# Patient Record
Sex: Male | Born: 1965 | Race: Black or African American | Hispanic: No | Marital: Married | Smoking: Never smoker
Health system: Southern US, Community
[De-identification: ages and names within clinical notes are randomized; demographics above are authoritative.]

## PROBLEM LIST (undated history)

## (undated) DIAGNOSIS — E119 Type 2 diabetes mellitus without complications: Secondary | ICD-10-CM

## (undated) DIAGNOSIS — G4733 Obstructive sleep apnea (adult) (pediatric): Secondary | ICD-10-CM

---

## 2021-01-16 ENCOUNTER — Emergency Department (HOSPITAL_COMMUNITY): Payer: PRIVATE HEALTH INSURANCE

## 2021-01-16 ENCOUNTER — Other Ambulatory Visit: Payer: Self-pay

## 2021-01-16 ENCOUNTER — Observation Stay (HOSPITAL_COMMUNITY)
Admission: EM | Admit: 2021-01-16 | Discharge: 2021-01-18 | Disposition: A | Discharge status: Home / Self Care | Payer: PRIVATE HEALTH INSURANCE | Attending: Family Medicine | Admitting: Family Medicine

## 2021-01-16 ENCOUNTER — Encounter (HOSPITAL_COMMUNITY): Payer: Self-pay

## 2021-01-16 DIAGNOSIS — Z794 Long term (current) use of insulin: Secondary | ICD-10-CM | POA: Insufficient documentation

## 2021-01-16 DIAGNOSIS — E119 Type 2 diabetes mellitus without complications: Secondary | ICD-10-CM | POA: Insufficient documentation

## 2021-01-16 DIAGNOSIS — R42 Dizziness and giddiness: Secondary | ICD-10-CM | POA: Diagnosis present

## 2021-01-16 DIAGNOSIS — G510 Bell's palsy: Principal | ICD-10-CM | POA: Insufficient documentation

## 2021-01-16 DIAGNOSIS — Z7984 Long term (current) use of oral hypoglycemic drugs: Secondary | ICD-10-CM | POA: Insufficient documentation

## 2021-01-16 DIAGNOSIS — Z20822 Contact with and (suspected) exposure to covid-19: Secondary | ICD-10-CM | POA: Diagnosis not present

## 2021-01-16 HISTORY — DX: Type 2 diabetes mellitus without complications: E11.9

## 2021-01-16 HISTORY — DX: Obstructive sleep apnea (adult) (pediatric): G47.33

## 2021-01-16 LAB — CBC WITH DIFFERENTIAL/PLATELET
Abs Immature Granulocytes: 0.05 10*3/uL (ref 0.00–0.07)
Basophils Absolute: 0 10*3/uL (ref 0.0–0.1)
Basophils Relative: 0 %
Eosinophils Absolute: 0.1 10*3/uL (ref 0.0–0.5)
Eosinophils Relative: 1 %
HCT: 41.1 % (ref 39.0–52.0)
Hemoglobin: 14 g/dL (ref 13.0–17.0)
Immature Granulocytes: 1 %
Lymphocytes Relative: 17 %
Lymphs Abs: 1.1 10*3/uL (ref 0.7–4.0)
MCH: 29.6 pg (ref 26.0–34.0)
MCHC: 34.1 g/dL (ref 30.0–36.0)
MCV: 86.9 fL (ref 80.0–100.0)
Monocytes Absolute: 0.4 10*3/uL (ref 0.1–1.0)
Monocytes Relative: 5 %
Neutro Abs: 5.2 10*3/uL (ref 1.7–7.7)
Neutrophils Relative %: 76 %
Platelets: 170 10*3/uL (ref 150–400)
RBC: 4.73 MIL/uL (ref 4.22–5.81)
RDW: 13 % (ref 11.5–15.5)
WBC: 6.8 10*3/uL (ref 4.0–10.5)
nRBC: 0 % (ref 0.0–0.2)

## 2021-01-16 LAB — COMPREHENSIVE METABOLIC PANEL
ALT: 53 U/L — ABNORMAL HIGH (ref 0–44)
AST: 28 U/L (ref 15–41)
Albumin: 4.3 g/dL (ref 3.5–5.0)
Alkaline Phosphatase: 57 U/L (ref 38–126)
Anion gap: 9 (ref 5–15)
BUN: 13 mg/dL (ref 6–20)
CO2: 25 mmol/L (ref 22–32)
Calcium: 9.1 mg/dL (ref 8.9–10.3)
Chloride: 104 mmol/L (ref 98–111)
Creatinine, Ser: 0.73 mg/dL (ref 0.61–1.24)
GFR, Estimated: >60 mL/min (ref >60)
Glucose, Bld: 265 mg/dL — ABNORMAL HIGH (ref 70–99)
Potassium: 4.2 mmol/L (ref 3.5–5.1)
Sodium: 138 mmol/L (ref 135–145)
Total Bilirubin: 1.1 mg/dL (ref 0.3–1.2)
Total Protein: 6.8 g/dL (ref 6.5–8.1)

## 2021-01-16 LAB — CBC
HCT: 40.6 % (ref 39.0–52.0)
Hemoglobin: 13.6 g/dL (ref 13.0–17.0)
MCH: 29.4 pg (ref 26.0–34.0)
MCHC: 33.5 g/dL (ref 30.0–36.0)
MCV: 87.9 fL (ref 80.0–100.0)
Platelets: 194 10*3/uL (ref 150–400)
RBC: 4.62 MIL/uL (ref 4.22–5.81)
RDW: 13.1 % (ref 11.5–15.5)
WBC: 7.7 10*3/uL (ref 4.0–10.5)
nRBC: 0 % (ref 0.0–0.2)

## 2021-01-16 LAB — GLUCOSE, CAPILLARY
Glucose-Capillary: 163 mg/dL — ABNORMAL HIGH (ref 70–99)
Glucose-Capillary: 162 mg/dL — ABNORMAL HIGH (ref 70–99)

## 2021-01-16 LAB — LIPASE, BLOOD: Lipase: 25 U/L (ref 11–51)

## 2021-01-16 LAB — HIV ANTIBODY (ROUTINE TESTING W REFLEX): HIV Screen 4th Generation wRfx: NONREACTIVE

## 2021-01-16 LAB — CREATININE, SERUM
Creatinine, Ser: 0.78 mg/dL (ref 0.61–1.24)
GFR, Estimated: >60 mL/min (ref >60)

## 2021-01-16 LAB — SARS CORONAVIRUS 2 (TAT 6-24 HRS): SARS Coronavirus 2: NEGATIVE

## 2021-01-16 LAB — HEMOGLOBIN A1C
Hgb A1c MFr Bld: 8.8 % — ABNORMAL HIGH (ref 4.8–5.6)
Mean Plasma Glucose: 205.86 mg/dL

## 2021-01-16 MED ORDER — SODIUM CHLORIDE 0.9 % IV BOLUS
1000.0000 mL | Freq: Once | INTRAVENOUS | Status: AC
  Administered 2021-01-16: 1000 mL via INTRAVENOUS

## 2021-01-16 MED ORDER — ENOXAPARIN SODIUM 60 MG/0.6ML ~~LOC~~ SOLN
60.0000 mg | SUBCUTANEOUS | Status: DC
  Administered 2021-01-16 – 2021-01-17 (×2): 60 mg via SUBCUTANEOUS
  Filled 2021-01-16 (×2): qty 0.6

## 2021-01-16 MED ORDER — INSULIN ASPART 100 UNIT/ML ~~LOC~~ SOLN
0.0000 [IU] | Freq: Every day | SUBCUTANEOUS | Status: DC
  Administered 2021-01-17: 3 [IU] via SUBCUTANEOUS

## 2021-01-16 MED ORDER — INSULIN ASPART 100 UNIT/ML ~~LOC~~ SOLN
0.0000 [IU] | Freq: Three times a day (TID) | SUBCUTANEOUS | Status: DC
  Administered 2021-01-16: 4 [IU] via SUBCUTANEOUS
  Administered 2021-01-17: 7 [IU] via SUBCUTANEOUS
  Administered 2021-01-18: 11 [IU] via SUBCUTANEOUS
  Administered 2021-01-18: 3 [IU] via SUBCUTANEOUS

## 2021-01-16 MED ORDER — ACETAMINOPHEN 325 MG PO TABS: 650.0000 mg | ORAL_TABLET | Freq: Four times a day (QID) | ORAL | Status: DC | PRN

## 2021-01-16 MED ORDER — POLYVINYL ALCOHOL 1.4 % OP SOLN
1.0000 [drp] | Freq: Every day | OPHTHALMIC | Status: DC
  Administered 2021-01-18: 1 [drp] via OPHTHALMIC
  Filled 2021-01-16 (×2): qty 15

## 2021-01-16 MED ORDER — LORAZEPAM 2 MG/ML IJ SOLN
0.5000 mg | Freq: Once | INTRAMUSCULAR | Status: AC
  Administered 2021-01-16: 0.5 mg via INTRAVENOUS
  Filled 2021-01-16: qty 1

## 2021-01-16 MED ORDER — PIMECROLIMUS 1 % EX CREA: 1.0000 "application " | TOPICAL_CREAM | Freq: Two times a day (BID) | CUTANEOUS | Status: DC

## 2021-01-16 MED ORDER — NAPHAZOLINE-GLYCERIN 0.012-0.2 % OP SOLN: 2.0000 [drp] | Freq: Every morning | OPHTHALMIC | Status: DC

## 2021-01-16 MED ORDER — ONDANSETRON HCL 4 MG/2ML IJ SOLN
4.0000 mg | Freq: Once | INTRAMUSCULAR | Status: AC
  Administered 2021-01-16: 4 mg via INTRAVENOUS
  Filled 2021-01-16: qty 2

## 2021-01-16 MED ORDER — ACETAMINOPHEN 650 MG RE SUPP: 650.0000 mg | Freq: Four times a day (QID) | RECTAL | Status: DC | PRN

## 2021-01-16 MED ORDER — ATORVASTATIN CALCIUM 20 MG PO TABS
20.0000 mg | ORAL_TABLET | Freq: Every day | ORAL | Status: DC
  Administered 2021-01-17 – 2021-01-18 (×2): 20 mg via ORAL
  Filled 2021-01-16 (×2): qty 1

## 2021-01-16 MED ORDER — MECLIZINE HCL 25 MG PO TABS
25.0000 mg | ORAL_TABLET | Freq: Once | ORAL | Status: AC
  Administered 2021-01-16: 25 mg via ORAL
  Filled 2021-01-16: qty 1

## 2021-01-16 MED ORDER — INSULIN NPH (HUMAN) (ISOPHANE) 100 UNIT/ML ~~LOC~~ SUSP
30.0000 [IU] | Freq: Two times a day (BID) | SUBCUTANEOUS | Status: DC
  Administered 2021-01-16 – 2021-01-18 (×4): 30 [IU] via SUBCUTANEOUS
  Filled 2021-01-16: qty 10

## 2021-01-16 MED ORDER — SODIUM CHLORIDE 0.9 % IV SOLN: INTRAVENOUS | Status: DC

## 2021-01-16 MED ORDER — MECLIZINE HCL 25 MG PO TABS
50.0000 mg | ORAL_TABLET | Freq: Three times a day (TID) | ORAL | Status: DC
  Administered 2021-01-16 – 2021-01-18 (×6): 50 mg via ORAL
  Filled 2021-01-16 (×7): qty 2

## 2021-01-16 MED ORDER — PROCHLORPERAZINE EDISYLATE 10 MG/2ML IJ SOLN
10.0000 mg | Freq: Four times a day (QID) | INTRAMUSCULAR | Status: DC | PRN
  Administered 2021-01-16 – 2021-01-17 (×2): 10 mg via INTRAVENOUS
  Filled 2021-01-16 (×2): qty 2

## 2021-01-16 NOTE — ED Notes (Signed)
Pt to MRI

## 2021-01-16 NOTE — ED Triage Notes (Signed)
Pt sts going out to eat last night around 9pm and has been nauseated, vomiting, dizzy since.

## 2021-01-16 NOTE — H&P (Signed)
History and Physical    John Barber OVF:[id number] DOB: [birth year] DOA: 01/16/2021  PCP: Pcp, No  Patient coming from: [facility]  Chief Complaint: "room spinning"  HPI: John Barber is a 55 y.o. male with medical history significant of diabetes. Presenting with dizziness, N/V. Reports that he was at dinner last night. When he went to leave, he became lightheaded and dizziness. He staggered as if he were drunk; but he had no alcohol. He complained of "room spinning" and vomited once he got back to his friend's car. All through out the night, he had continued N/V and dizziness. He was unable to take any medicines to assist. He decided that he needed help in the ED. He denies any other aggravating or alleviating factors.    ED Course: Other than hyperglycemia, his lab work was benign. MRI showed a "minor burden of nonspecific gliosis/demyelination in the cerebral white matter", but was otherwise negative. He was unable to be controlled with antivert, ativan, and zofran. TRH was called for admission.   Review of Systems:  Denies CP, palpitations, dyspnea, hematemesis, fever, visual changes. Review of systems is otherwise negative for all not mentioned in HPI.   PMHx DM2 on insulin  PSHx History reviewed. No pertinent surgical history.  SocHx  reports that he has never smoked. He has never used smokeless tobacco. He reports previous alcohol use. He reports that he does not use drugs.  No Known Allergies  FamHx Review and noncontributory  Prior to Admission medications   Medication Sig Start Date End Date Taking? Authorizing Provider  acetaminophen (TYLENOL) 650 MG CR tablet Take 2,600 mg by mouth daily as needed for pain. 02/12/20 02/12/21 Yes [provider]  atorvastatin (LIPITOR) 20 MG tablet Take 20 mg by mouth daily. 02/17/18 11/27/21 Yes [provider]  glipiZIDE (GLUCOTROL) 10 MG tablet Take 20 mg by mouth 2 (two) times daily before a meal. 01/10/21  01/10/23 Yes [provider]  Insulin NPH, Human,, Isophane, (HUMULIN N KWIKPEN) 100 UNIT/ML Kiwkpen Inject 30-45 Units into the skin See admin instructions. Inject 30 units daily with lunch and 45 units at bedtime. Increase by 2 units at lunch until bedtime blood sugar is less than 140. 07/05/20 07/06/22 Yes [provider]  metFORMIN (GLUCOPHAGE) 1000 MG tablet Take 1,000 mg by mouth 2 (two) times daily. 01/10/21  Yes [provider]  naphazoline-glycerin (CLEAR EYES REDNESS) 0.012-0.2 % SOLN Place 2 drops into both eyes in the morning.   Yes [provider]  pimecrolimus (ELIDEL) 1 % cream Apply 1 application topically in the morning and at bedtime. 09/20/20 10/23/22 Yes [provider]  testosterone cypionate (DEPOTESTOSTERONE CYPIONATE) 200 MG/ML injection Inject 200 mg into the muscle once a week. 01/10/21 07/12/21 Yes [provider]    Physical Exam: Vitals:   01/16/21 0945 01/16/21 1000 01/16/21 1015 01/16/21 1147  BP: 117/78 109/75 118/74 129/74  Pulse: 65 86 72 62  Resp:   16 (!) 21  Temp:      TempSrc:      SpO2: 91% 91% (!) 88% 93%  Weight:      Height:        General: 55 y.o. male resting in bed in NAD Eyes: PERRL, normal sclera ENMT: Nares patent w/o discharge, orophaynx clear, dentition normal, ears w/o discharge/lesions/ulcers Neck: Supple, trachea midline Cardiovascular: RRR, +S1, S2, no m/g/r, equal pulses throughout Respiratory: CTABL, no w/r/r, normal WOB GI: BS+, NDNT, no masses noted, no organomegaly noted MSK: No e/c/c  Neuro: A&O x 3, no focal deficits, nystagmus noted on exam Psyc: Appropriate interaction and affect, calm/cooperative  Labs on Admission: I have personally reviewed following labs and imaging studies  CBC: Recent Labs  Lab 01/16/21 0738  WBC 6.8  NEUTROABS 5.2  HGB 14.0  HCT 41.1  MCV 86.9  PLT 170   Basic Metabolic Panel: Recent Labs  Lab 01/16/21 0738  NA 138  K 4.2  CL 104  CO2  25  GLUCOSE 265*  BUN 13  CREATININE 0.73  CALCIUM 9.1   GFR: Estimated Creatinine Clearance: 143 mL/min (by C-G formula based on SCr of 0.73 mg/dL). Liver Function Tests: Recent Labs  Lab 01/16/21 0738  AST 28  ALT 53*  ALKPHOS 57  BILITOT 1.1  PROT 6.8  ALBUMIN 4.3   Recent Labs  Lab 01/16/21 0738  LIPASE 25   No results for input(s): AMMONIA in the last 168 hours. Coagulation Profile: No results for input(s): INR, PROTIME in the last 168 hours. Cardiac Enzymes: No results for input(s): CKTOTAL, CKMB, CKMBINDEX, TROPONINI in the last 168 hours. BNP (last 3 results) No results for input(s): PROBNP in the last 8760 hours. HbA1C: No results for input(s): HGBA1C in the last 72 hours. CBG: No results for input(s): GLUCAP in the last 168 hours. Lipid Profile: No results for input(s): CHOL, HDL, LDLCALC, TRIG, CHOLHDL, LDLDIRECT in the last 72 hours. Thyroid Function Tests: No results for input(s): TSH, T4TOTAL, FREET4, T3FREE, THYROIDAB in the last 72 hours. Anemia Panel: No results for input(s): VITAMINB12, FOLATE, FERRITIN, TIBC, IRON, RETICCTPCT in the last 72 hours. Urine analysis: No results found for: COLORURINE, APPEARANCEUR, LABSPEC, PHURINE, GLUCOSEU, HGBUR, BILIRUBINUR, KETONESUR, PROTEINUR, UROBILINOGEN, NITRITE, LEUKOCYTESUR  Radiological Exams on Admission: MR BRAIN WO CONTRAST  Result Date: 01/16/2021 CLINICAL DATA:  Vertigo EXAM: MRI HEAD WITHOUT CONTRAST TECHNIQUE: Multiplanar, multiecho pulse sequences of the brain and surrounding structures were obtained without intravenous contrast. COMPARISON:  None. FINDINGS: Brain: There is no acute infarction or intracranial hemorrhage. There is no intracranial mass, mass effect, or edema. There is no hydrocephalus or extra-axial fluid collection. Ventricles and sulci are normal in size and configuration. Few scattered small foci of T2 hyperintensity in the supratentorial white matter likely reflect nonspecific  gliosis/demyelination. Vascular: Major vessel flow voids at the skull base are preserved. Skull and upper cervical spine: Normal marrow signal is preserved. Sinuses/Orbits: Paranasal sinuses are aerated. Orbits are unremarkable. Other: Sella is unremarkable.  Mastoid air cells are clear. IMPRESSION: No evidence of recent infarction, hemorrhage, or mass. Minor burden of nonspecific gliosis/demyelination in the cerebral white matter. Electronically Signed   By: Guadlupe Spanish M.D.   On: 01/16/2021 11:34    EKG: Independently reviewed. Sinus bradycardia  Assessment/Plan Vertigo     - place in obs, med-surg     - will have PT do vestibular eval     - compazine, antivert  DM2     - SSI, DM diet, glucose checks, NPH, A1c  Sinus bradycardia     - asymptomatic, follow up outpt  Elevated LFTs     - minor elevation of ALT     - fluids, trend  DVT prophylaxis: lovenox  Code Status: FULL  Family Communication: None at bedside  Consults called: None   Status is: Observation  The patient remains OBS appropriate and will d/c before 2 midnights.  Dispo: The patient is from: Home              Anticipated d/c is to:  Home              Patient currently is not medically stable to d/c.   Difficult to place patient No  Teddy Spike DO [facility]  If 7PM-7AM, please contact night-coverage [email or web address]  01/16/2021, 12:28 PM

## 2021-01-16 NOTE — ED Provider Notes (Signed)
[facility]-EMERGENCY DEPT Provider Note   CSN: [id number] Arrival date & time: 01/16/21  1443     History Chief Complaint  Patient presents with  . Vomiting    John Barber is a 55 y.o. male.  55 year old male with prior medical history as detailed below presents for evaluation.  Patient reports acute onset of vertigo last night around 9 PM.  He reports multiple episodes of vomiting.  Symptoms are worse with standing or with movement of his head  Denies slurred speech, vision change, focal weakness, or other specific complaint.  He denies prior history of vertigo.  Of note, patient is traveling to [place] from [place].  The history is provided by the patient and medical records.  Dizziness Quality:  Vertigo Severity:  Severe Onset quality:  Sudden Duration:  1 day Timing:  Constant Progression:  Unchanged Chronicity:  New Context: head movement and standing up   Relieved by:  Nothing Worsened by:  Nothing      History reviewed. No pertinent past medical history.  Patient Active Problem List   Diagnosis Date Noted  . Vertigo 01/16/2021    History reviewed. No pertinent surgical history.     No family history on file.  Social History   Tobacco Use  . Smoking status: Never Smoker  . Smokeless tobacco: Never Used  Substance Use Topics  . Alcohol use: Not Currently  . Drug use: Never    Home Medications Prior to Admission medications   Medication Sig Start Date End Date Taking? Authorizing Provider  acetaminophen (TYLENOL) 650 MG CR tablet Take 2,600 mg by mouth daily as needed for pain. 02/12/20 02/12/21 Yes [provider]  atorvastatin (LIPITOR) 20 MG tablet Take 20 mg by mouth daily. 02/17/18 11/27/21 Yes [provider]  glipiZIDE (GLUCOTROL) 10 MG tablet Take 20 mg by mouth 2 (two) times daily before a meal. 01/10/21 01/10/23 Yes [provider]  Insulin NPH, Human,, Isophane, (HUMULIN N  KWIKPEN) 100 UNIT/ML Kiwkpen Inject 30-45 Units into the skin See admin instructions. Inject 30 units daily with lunch and 45 units at bedtime. Increase by 2 units at lunch until bedtime blood sugar is less than 140. 07/05/20 07/06/22 Yes [provider]  metFORMIN (GLUCOPHAGE) 1000 MG tablet Take 1,000 mg by mouth 2 (two) times daily. 01/10/21  Yes [provider]  naphazoline-glycerin (CLEAR EYES REDNESS) 0.012-0.2 % SOLN Place 2 drops into both eyes in the morning.   Yes [provider]  pimecrolimus (ELIDEL) 1 % cream Apply 1 application topically in the morning and at bedtime. 09/20/20 10/23/22 Yes [provider]  testosterone cypionate (DEPOTESTOSTERONE CYPIONATE) 200 MG/ML injection Inject 200 mg into the muscle once a week. 01/10/21 07/12/21 Yes [provider]    Allergies    Patient has no known allergies.  Review of Systems   Review of Systems  Neurological: Positive for dizziness.  All other systems reviewed and are negative.   Physical Exam Updated Vital Signs BP 129/74 (BP Location: Right Arm)   Pulse 62   Temp 98.2 F (36.8 C)   Resp (!) 21   Ht 6' 1.5" (1.867 m)   Wt 117.9 kg   SpO2 93%   BMI 33.84 kg/m   Physical Exam Vitals and nursing note reviewed.  Constitutional:      General: He is not in acute distress.    Appearance: Normal appearance. He is well-developed.  HENT:     Head: Normocephalic and atraumatic.  Eyes:     Conjunctiva/sclera: Conjunctivae normal.     Pupils: Pupils are equal, round, and reactive to light.     Comments: Bilateral horizontal nystagmus  Cardiovascular:     Rate and Rhythm: Normal rate and regular rhythm.     Heart sounds: Normal heart sounds.  Pulmonary:     Effort: Pulmonary effort is normal. No respiratory distress.     Breath sounds: Normal breath sounds.  Abdominal:     General: There is no distension.     Palpations: Abdomen is soft.     Tenderness: There is no abdominal  tenderness.  Musculoskeletal:        General: No deformity. Normal range of motion.     Cervical back: Normal range of motion and neck supple.  Skin:    General: Skin is warm and dry.  Neurological:     General: No focal deficit present.     Mental Status: He is alert and oriented to person, place, and time.     Cranial Nerves: No cranial nerve deficit.     Sensory: No sensory deficit.     Motor: No weakness.     Coordination: Coordination normal.     ED Results / Procedures / Treatments   Labs (all labs ordered are listed, but only abnormal results are displayed) Labs Reviewed  COMPREHENSIVE METABOLIC PANEL - Abnormal; Notable for the following components:      Result Value   Glucose, Bld 265 (*)    ALT 53 (*)    All other components within normal limits  SARS CORONAVIRUS 2 (TAT 6-24 HRS)  LIPASE, BLOOD  CBC WITH DIFFERENTIAL/PLATELET    EKG EKG Interpretation  Date/Time:  Thursday January 16 2021 07:34:59 EDT Ventricular Rate:  48 PR Interval:    QRS Duration: 117 QT Interval:  476 QTC Calculation: 426 R Axis:   98 Text Interpretation: Sinus bradycardia Nonspecific intraventricular conduction delay Confirmed by Kristine Royal [phone]) on 01/16/2021 7:39:10 AM   Radiology MR BRAIN WO CONTRAST  Result Date: 01/16/2021 CLINICAL DATA:  Vertigo EXAM: MRI HEAD WITHOUT CONTRAST TECHNIQUE: Multiplanar, multiecho pulse sequences of the brain and surrounding structures were obtained without intravenous contrast. COMPARISON:  None. FINDINGS: Brain: There is no acute infarction or intracranial hemorrhage. There is no intracranial mass, mass effect, or edema. There is no hydrocephalus or extra-axial fluid collection. Ventricles and sulci are normal in size and configuration. Few scattered small foci of T2 hyperintensity in the supratentorial white matter likely reflect nonspecific gliosis/demyelination. Vascular: Major vessel flow voids at the skull base are preserved. Skull and upper  cervical spine: Normal marrow signal is preserved. Sinuses/Orbits: Paranasal sinuses are aerated. Orbits are unremarkable. Other: Sella is unremarkable.  Mastoid air cells are clear. IMPRESSION: No evidence of recent infarction, hemorrhage, or mass. Minor burden of nonspecific gliosis/demyelination in the cerebral white matter. Electronically Signed   By: Guadlupe Spanish M.D.   On: 01/16/2021 11:34    Procedures Procedures   Medications Ordered in ED Medications  LORazepam (ATIVAN) injection 0.5 mg (has no administration in time range)  sodium chloride 0.9 % bolus 1,000 mL (0 mLs Intravenous Stopped 01/16/21 0830)  ondansetron (ZOFRAN) injection 4 mg (4 mg Intravenous Given 01/16/21 0753)  meclizine (ANTIVERT) tablet 25 mg (25 mg Oral Given 01/16/21 0756)  LORazepam (ATIVAN) injection 0.5 mg (0.5 mg Intravenous Given 01/16/21 0927)  ondansetron (ZOFRAN) injection 4 mg (4 mg Intravenous Given 01/16/21 7106)    ED Course  I have reviewed the triage  vital signs and the nursing notes.  Pertinent labs & imaging results that were available during my care of the patient were reviewed by me and considered in my medical decision making (see chart for details).    MDM Rules/Calculators/A&P                          MDM  Screen complete  John Barber was evaluated in Emergency Department on 01/16/2021 for the symptoms described in the history of present illness. He was evaluated in the context of the global COVID-19 pandemic, which necessitated consideration that the patient might be at risk for infection with the SARS-CoV-2 virus that causes COVID-19. Institutional protocols and algorithms that pertain to the evaluation of patients at risk for COVID-19 are in a state of rapid change based on information released by regulatory bodies including the CDC and federal and state organizations. These policies and algorithms were followed during the patient's care in the ED.  Patient is presenting with  significant vertiginous symptoms.  Patient's exam is consistent with likely peripheral vertigo.  Patient without significant improvement after Antivert and Zofran.  MRI brain obtained.  No acute findings on MRI.  Patient with continued significant symptoms.  Hospitalist service will evaluate for likely admission.   Final Clinical Impression(s) / ED Diagnoses Final diagnoses:  Vertigo    Rx / DC Orders ED Discharge Orders    None       Wynetta Fines, MD 01/16/21 1218

## 2021-01-17 ENCOUNTER — Observation Stay (HOSPITAL_COMMUNITY): Payer: PRIVATE HEALTH INSURANCE

## 2021-01-17 DIAGNOSIS — G510 Bell's palsy: Secondary | ICD-10-CM

## 2021-01-17 DIAGNOSIS — R202 Paresthesia of skin: Secondary | ICD-10-CM

## 2021-01-17 DIAGNOSIS — R42 Dizziness and giddiness: Secondary | ICD-10-CM | POA: Diagnosis not present

## 2021-01-17 DIAGNOSIS — R200 Anesthesia of skin: Secondary | ICD-10-CM

## 2021-01-17 LAB — COMPREHENSIVE METABOLIC PANEL
ALT: 44 U/L (ref 0–44)
AST: 24 U/L (ref 15–41)
Albumin: 3.6 g/dL (ref 3.5–5.0)
Alkaline Phosphatase: 51 U/L (ref 38–126)
Anion gap: 7 (ref 5–15)
BUN: 14 mg/dL (ref 6–20)
CO2: 26 mmol/L (ref 22–32)
Calcium: 8.8 mg/dL — ABNORMAL LOW (ref 8.9–10.3)
Chloride: 107 mmol/L (ref 98–111)
Creatinine, Ser: 0.72 mg/dL (ref 0.61–1.24)
GFR, Estimated: >60 mL/min (ref >60)
Glucose, Bld: 165 mg/dL — ABNORMAL HIGH (ref 70–99)
Potassium: 3.6 mmol/L (ref 3.5–5.1)
Sodium: 140 mmol/L (ref 135–145)
Total Bilirubin: 0.9 mg/dL (ref 0.3–1.2)
Total Protein: 5.9 g/dL — ABNORMAL LOW (ref 6.5–8.1)

## 2021-01-17 LAB — CBC
HCT: 40.6 % (ref 39.0–52.0)
Hemoglobin: 13.4 g/dL (ref 13.0–17.0)
MCH: 29.3 pg (ref 26.0–34.0)
MCHC: 33 g/dL (ref 30.0–36.0)
MCV: 88.8 fL (ref 80.0–100.0)
Platelets: 182 10*3/uL (ref 150–400)
RBC: 4.57 MIL/uL (ref 4.22–5.81)
RDW: 13.1 % (ref 11.5–15.5)
WBC: 6.6 10*3/uL (ref 4.0–10.5)
nRBC: 0 % (ref 0.0–0.2)

## 2021-01-17 LAB — GLUCOSE, CAPILLARY
Glucose-Capillary: 118 mg/dL — ABNORMAL HIGH (ref 70–99)
Glucose-Capillary: 209 mg/dL — ABNORMAL HIGH (ref 70–99)
Glucose-Capillary: 253 mg/dL — ABNORMAL HIGH (ref 70–99)

## 2021-01-17 LAB — TSH: TSH: 0.402 u[IU]/mL (ref 0.350–4.500)

## 2021-01-17 MED ORDER — VALACYCLOVIR HCL 500 MG PO TABS
1000.0000 mg | ORAL_TABLET | Freq: Three times a day (TID) | ORAL | Status: DC
  Administered 2021-01-17 – 2021-01-18 (×3): 1000 mg via ORAL
  Filled 2021-01-17 (×4): qty 2

## 2021-01-17 MED ORDER — PREDNISONE 20 MG PO TABS
60.0000 mg | ORAL_TABLET | Freq: Every day | ORAL | Status: DC
  Administered 2021-01-17 – 2021-01-18 (×2): 60 mg via ORAL
  Filled 2021-01-17 (×2): qty 3

## 2021-01-17 MED ORDER — GADOBUTROL 1 MMOL/ML IV SOLN
10.0000 mL | Freq: Once | INTRAVENOUS | Status: AC | PRN
  Administered 2021-01-17: 10 mL via INTRAVENOUS

## 2021-01-17 MED ORDER — IOHEXOL 350 MG/ML SOLN
100.0000 mL | Freq: Once | INTRAVENOUS | Status: AC | PRN
  Administered 2021-01-17: 100 mL via INTRAVENOUS

## 2021-01-17 NOTE — Progress Notes (Addendum)
Physical Therapy Evaluation Patient Details Name: John Barber MRN: [id number] DOB: 01-23-1966 Today's Date: 01/17/2021   History of Present Illness  John Barber is a 55 y.o. male with medical history significant of diabetes. Presenting with dizziness, N/V. Reports that he was at dinner last night. When he went to leave, he became lightheaded and dizziness. He staggered as if he were drunk; but he had no alcohol. He complained of "room spinning" and vomited once he got back to his friend's car. All through out the night, he had continued N/V and dizziness.    Clinical Impression  John Barber is 55 y.o. male admitted with above HPI and diagnosis. Patient is currently limited by functional impairments below (see PT problem list). Patient lives in [place] and is visiting friends/family here in [place]. He is independent at baseline.  Vestibular evaluation indicates pt has 3rd degree Lt beating nystagmus. Ethel Rana testing negative bil and vertigo symptoms are more constant and do not resolve with time. Head impulse test indicates hypotensive response to Rt side with corrective horizontal saccade that may be indicative of Rt diminished VOR. Skew testing was negative for vertical skew but slight horizontal skew seen Rt eye>Lt eye. Patient on gross observation also appears to have slight ptosis of Rt eye. With sustained gaze testing pt experienced greater dizziness with Lt gaze and with Rt gaze he reported vision appeared more clear. 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 He may be experiencing hypofunction of CN7 also leading to Rt sided facial droop and weakness. We will follow up for additional vestibular testing with fixation suppressed. Patient will benefit from continued skilled PT interventions to address impairments and progress independence with mobility, recommending follow up with neurology, ENT, and OPPT for vestibular  testing and treatment. Acute PT will follow and progress as able.     Follow Up Recommendations ENT/neurotologist        01/16/21 1700  PT Visit Information  Last PT Received On 01/16/21  Assistance Needed +1  History of Present Illness John Barber is a 55 y.o. male with medical history significant of diabetes. Presenting with dizziness, N/V. Reports that he was at dinner last night. When he went to leave, he became lightheaded and dizziness. He staggered as if he were drunk; but he had no alcohol. He complained of "room spinning" and vomited once he got back to his friend's car. All through out the night, he had continued N/V and dizziness.  Precautions  Precautions Fall  Restrictions  Weight Bearing Restrictions No  Home Living  Family/patient expects to be discharged to: Private residence  Living Arrangements Spouse/significant other;Children  Additional Comments pt flew in from [place] to visit people on [place], first [place] then came down to [place].  Prior Function  Level of Independence Independent  Communication  Communication No difficulties  Cognition  Arousal/Alertness Awake/alert  Behavior During Therapy WFL for tasks assessed/performed  Overall Cognitive Status Within Functional Limits for tasks assessed  Upper Extremity Assessment  Upper Extremity Assessment Overall WFL for tasks assessed  Lower Extremity Assessment  Lower Extremity Assessment Overall WFL for tasks assessed  Cervical / Trunk Assessment  Cervical / Trunk Assessment Normal  Bed Mobility  Overal bed mobility Independent  General bed mobility comments pt able to complete supine<>sit and pivot/turn in bed to sit in long sitting position for Epley's Maneuver.    VESTIBULAR EXAM   01/16/21 0001  Symptom Behavior  Subjective history of  current problem John Barber is a 55 y.o. male with medical history significant of diabetes. Presenting with dizziness, N/V. Reports that he was at dinner  last night. When he went to leave, he became lightheaded and dizziness. He staggered as if he were drunk; but he had no alcohol. He complained of "room spinning" and vomited once he got back to his friend's car. All through out the night, he had continued N/V and dizziness. He was unable to take any medicines to assist. He decided that he needed help in the ED. He denies any other aggravating or alleviating factors prior to testing.  Type of Dizziness  Oscillopsia;Blurred vision;Imbalance;Unsteady with head/body turns;Vertigo  Frequency of Dizziness sudden onset and now it is constant.  Duration of Dizziness Constant  Symptom Nature Constant  Aggravating Factors Spontaneous onset;Moving eyes;Supine to sit;Turning head sideways;Turning head quickly;Activity in general  Relieving Factors Dark room;Closing eyes;Comments (Rt gaze improves vision per pt during testing)  Progression of Symptoms No change since onset  History of similar episodes no prior episodes per pt recall/report.  Oculomotor Exam  Oculomotor Alignment Abnormal (Rt eye appears slightly drifted to Rt when pt asked to stare straight ahead.)  Ocular ROM WNLs  Spontaneous Left beating nystagmus (pt has 3rd degrees nystagmus)  Gaze-induced  Left beating nystagmus with L gaze;Left beating nystagmus with R gaze (nystagmus decreased with Rt gaze and increased with Lt gaze)  Head shaking Horizontal Comment  Head Shaking Vertical Comment  Smooth Pursuits Saccades (performed in neutral head position)  Comment HINTS Testing: HI = hypoactive VOR (possible diminished  ipsilateral VOR); Skew = pt is negative for vertical skew but does appear to have slight horizontal skew Rt eye>Lt eye.  Vestibulo-Ocular Reflex  Comment head impulse - Postiive on Lt  Positional Testing  Dix-Hallpike Dix-Hallpike Right;Dix-Hallpike Left  Dix-Hallpike Right  Dix-Hallpike Right Duration Negtive: no change in symptoms and no resollution of nystagmus beyond 60  seconds.  Dix-Hallpike Right Symptoms Left nystagmus  Dix-Hallpike Left  Dix-Hallpike Left Duration Negtive: no change in symptoms and no resollution of nystagmus beyond 60 seconds.  Dix-Hallpike Left Symptoms Left nystagmus  Cognition  Cognition Orientation Level Oriented x 4  Cognition Comment pleasant and able to answer all questions appropriately  Positional Sensitivities  Sit to Supine 2  Supine to Left Side 2  Supine to Right Side 2  Supine to Sitting 2  Right Hallpike 2  Up from Right Hallpike 2  Up from Left Hallpike 2  Rolling Right 2  Rolling Left 2      01/16/21 1700  PT Time Calculation  PT Start Time (ACUTE ONLY) 1718  PT Stop Time (ACUTE ONLY) 1748  PT Time Calculation (min) (ACUTE ONLY) 30 min  PT General Charges  $$ ACUTE PT VISIT 1 Visit  PT Evaluation  $PT Eval Moderate Complexity 1 Mod  PT Treatments  $Physical Performance Test 8-22 mins    Wynn Maudlin, DPT Acute Rehabilitation Services Office [phone] Pager [phone]   Anitra Lauth 01/17/2021, 8:34 AM

## 2021-01-17 NOTE — Progress Notes (Signed)
PROGRESS NOTE    John Barber  UTM:[id number] DOB: 12-Mar-1966 DOA: 01/16/2021 PCP: Oneita Hurt, No   Brief Narrative: John Barber is a 55 y.o. male with a history of diabetes. Patient presented secondary to dizziness with concern for vertigo. MRI obtained on admission and was significant for non-specific gliosis/demyelination. He then developed symptoms/signs consistent with Bell's Palsy.   Assessment & Plan:   Active Problems:   Vertigo   Bell's palsy   Vertigo Unknown etiology. MRI without obvious etiology. Vestibular PT ordered and with concern for possible vestibular hypofunction leading to vertigo; recommending neurology, ENT and outpatient PT. -Continue Meclizine -Neurology consulted for vertigo, bell's palsy and L V3 distribution numbness  Bell's Palsy Right side. History of chicken pox but no history of varicella zoster. Able to close right eye completely -Prednisone 60 mg daily x7 days -Valacyclovir 1000 mg TID x7 days  Left lower chin numbness Left V3 distribution. -Neurology as mentioned above  Diabetes mellitus, type 2 Patient is on NPH 30 units at lunch and 45 units at bedtime in addition to glipizide 20 mg BID and metformin 1000 mg BID -Continue NPH 30 units BID and SSI  Hyperlipidemia -Continue Lipitor 20 mg daily  Sinus bradycardia Asymptomatic.  Elevated ALT Mild. Resolved.   DVT prophylaxis: Lovenox Code Status:   Code Status: Full Code Family Communication: None at bedside Disposition Plan: Discharge likely in 24 hours pending ability to ambulate/function and perform ADLs, neurology recommendations   Consultants:   Neurology  Procedures:   None  Antimicrobials:  Valacyclovir    Subjective: Room spinning whenever he opens his eyes. Worse with movement. Noticed some left lower face numbness this morning. Also noticed right facial drop this morning.  Objective: Vitals:   01/17/21 0119 01/17/21 0535 01/17/21 0750 01/17/21  1319  BP: 115/74 101/66 117/78 125/64  Pulse: (!) 56 (!) 51 (!) 50 (!) 52  Resp: 17 20 16 20   Temp: 98.6 F (37 C) 99 F (37.2 C) 98.1 F (36.7 C) 98.3 F (36.8 C)  TempSrc: Oral Oral Oral   SpO2: 94% 94% 95% 93%  Weight:      Height:        Intake/Output Summary (Last 24 hours) at 01/17/2021 1418 Last data filed at 01/17/2021 0600 Gross per 24 hour  Intake 1391.52 ml  Output 300 ml  Net 1091.52 ml   Filed Weights   01/16/21 0701  Weight: 117.9 kg    Examination:  General exam: Appears calm and comfortable Respiratory system: Clear to auscultation. Respiratory effort normal. Cardiovascular system: S1 & S2 heard, RRR. No murmurs, rubs, gallops or clicks. Gastrointestinal system: Abdomen is nondistended, soft and nontender. No organomegaly or masses felt. Normal bowel sounds heard. Central nervous system: Alert and oriented. Complete right facial droop. CN5 intact bilaterally except for left V3 distribution with notable decreased sensation. Could not elicit reflexes in upper/lower extremities bilaterally.  Musculoskeletal: No edema. No calf tenderness Skin: No cyanosis. No rashes Psychiatry: Judgement and insight appear normal. Mood & affect appropriate.     Data Reviewed: I have personally reviewed following labs and imaging studies  CBC Lab Results  Component Value Date   WBC 6.6 01/17/2021   RBC 4.57 01/17/2021   HGB 13.4 01/17/2021   HCT 40.6 01/17/2021   MCV 88.8 01/17/2021   MCH 29.3 01/17/2021   PLT 182 01/17/2021   MCHC 33.0 01/17/2021   RDW 13.1 01/17/2021   LYMPHSABS 1.1 01/16/2021   MONOABS 0.4 01/16/2021   EOSABS  0.1 01/16/2021   BASOSABS 0.0 01/16/2021     Last metabolic panel Lab Results  Component Value Date   NA 140 01/17/2021   K 3.6 01/17/2021   CL 107 01/17/2021   CO2 26 01/17/2021   BUN 14 01/17/2021   CREATININE 0.72 01/17/2021   GLUCOSE 165 (H) 01/17/2021   GFRNONAA >60 01/17/2021   CALCIUM 8.8 (L) 01/17/2021   PROT 5.9 (L)  01/17/2021   ALBUMIN 3.6 01/17/2021   BILITOT 0.9 01/17/2021   ALKPHOS 51 01/17/2021   AST 24 01/17/2021   ALT 44 01/17/2021   ANIONGAP 7 01/17/2021    CBG (last 3)  Recent Labs    01/16/21 2151 01/17/21 0742 01/17/21 1203  GLUCAP 162* 118* 209*     GFR: Estimated Creatinine Clearance: 143 mL/min (by C-G formula based on SCr of 0.72 mg/dL).  Coagulation Profile: No results for input(s): INR, PROTIME in the last 168 hours.  Recent Results (from the past 240 hour(s))  SARS CORONAVIRUS 2 (TAT 6-24 HRS) Nasopharyngeal Nasopharyngeal Swab     Status: None   Collection Time: 01/16/21 12:41 PM   Specimen: Nasopharyngeal Swab  Result Value Ref Range Status   SARS Coronavirus 2 NEGATIVE NEGATIVE Final    Comment: (NOTE) SARS-CoV-2 target nucleic acids are NOT DETECTED.  The SARS-CoV-2 RNA is generally detectable in upper and lower respiratory specimens during the acute phase of infection. Negative results do not preclude SARS-CoV-2 infection, do not rule out co-infections with other pathogens, and should not be used as the sole basis for treatment or other patient management decisions. Negative results must be combined with clinical observations, patient history, and epidemiological information. The expected result is Negative.  Fact Sheet for Patients: HairSlick.no  Fact Sheet for Healthcare Providers: quierodirigir.com  This test is not yet approved or cleared by the [place] FDA and  has been authorized for detection and/or diagnosis of SARS-CoV-2 by FDA under an Emergency Use Authorization (EUA). This EUA will remain  in effect (meaning this test can be used) for the duration of the COVID-19 declaration under Se ction 564(b)(1) of the Act, 21 U.S.C. section 360bbb-3(b)(1), unless the authorization is terminated or revoked sooner.  Performed at [facility], [address] [place], [place] [address]          Radiology Studies: MR BRAIN WO CONTRAST  Result Date: 01/16/2021 CLINICAL DATA:  Vertigo EXAM: MRI HEAD WITHOUT CONTRAST TECHNIQUE: Multiplanar, multiecho pulse sequences of the brain and surrounding structures were obtained without intravenous contrast. COMPARISON:  None. FINDINGS: Brain: There is no acute infarction or intracranial hemorrhage. There is no intracranial mass, mass effect, or edema. There is no hydrocephalus or extra-axial fluid collection. Ventricles and sulci are normal in size and configuration. Few scattered small foci of T2 hyperintensity in the supratentorial white matter likely reflect nonspecific gliosis/demyelination. Vascular: Major vessel flow voids at the skull base are preserved. Skull and upper cervical spine: Normal marrow signal is preserved. Sinuses/Orbits: Paranasal sinuses are aerated. Orbits are unremarkable. Other: Sella is unremarkable.  Mastoid air cells are clear. IMPRESSION: No evidence of recent infarction, hemorrhage, or mass. Minor burden of nonspecific gliosis/demyelination in the cerebral white matter. Electronically Signed   By: Guadlupe Spanish M.D.   On: 01/16/2021 11:34        Scheduled Meds: . atorvastatin  20 mg Oral Daily  . enoxaparin (LOVENOX) injection  60 mg Subcutaneous Q24H  . insulin aspart  0-20 Units Subcutaneous TID WC  . insulin aspart  0-5 Units Subcutaneous QHS  . insulin NPH Human  30 Units Subcutaneous BID AC & HS  . meclizine  50 mg Oral TID  . pimecrolimus  1 application Topical BID  . polyvinyl alcohol  1 drop Both Eyes Daily  . predniSONE  60 mg Oral Q breakfast   Continuous Infusions: . sodium chloride 100 mL/hr at 01/17/21 0520     LOS: 0 days     Jacquelin Hawking, MD [facility] 01/17/2021, 2:18 PM  If 7PM-7AM, please contact night-coverage [email or web address]

## 2021-01-17 NOTE — Plan of Care (Signed)
°  Problem: Coping: °Goal: Level of anxiety will decrease °Outcome: Progressing °  °

## 2021-01-17 NOTE — Consult Note (Signed)
Neurology Consultation  Reason for Consult: vertigo Referring Physician: Tora Duck, MD  CC: vertigo  History is obtained from: patient, chart  HPI: John Barber is a 55 y.o. male with a PMHx of IDDM II, obesity, hypogonadism on testosterone injection weekly, HLD, and OSA on CPAP. Patient here from [place] visiting friends. He was out to dinner on 01/15/21 and states that during the meal his tongue began to tingle and he had trouble tasting his food. Then, he became acutely dizzy described as the room spinning. Symptoms waxed and waned during dinner, but during dessert, his dizziness became more profound. He got up to leave and was staggering. When he got out to his friend's car, he vomited. He decided just to go back to hotel and sleep. However, upon awakening, his vertigo was still present, even worse, and he had dry heaves. At that point, he came to ED.   Patient states he has never had anything like this before. His right eye feels weak and he has a right facial droop since yesterday. Never had Bell's Palsy before. He had/has no HA. Denies VF defects or frank diplopia although he feekls his eyes jerking. Specifically looking to the L (contralateral to his R LMN facial droop) evokes L beating nystagmus with a rotary component. He denies recent viral illness, cold, flu, GI virus, or fever. States he has had fatigue for a few months but attributes that to having so much work to do and only getting 4 hours of sleep most nights. Full strength throughout. Denies numbness or tingling to hands. Has some to feet at times, but he has DM and states his PCP is just watching this. No urinary incontinence. No weakness of extremity. No difficulty with concentrating or memory problems. No tinnitus. Primary team started him on prednisone 60mg  daily and valacyclovir 1g tid for Bell's palsy and consulted neurology to r/o mimics.  No history of head trauma, brain tumor, brain surgery, ear surgery, or stroke.    TSH .402. HbA1c 8.8%. Sugar 118-265. HIV neg. CBC normal. BMP normal.    ROS: A 14 point ROS was performed and is negative except as noted in the HPI.   Past Medical History:  Diagnosis Date  . DM (diabetes mellitus) (HCC)   . OSA on CPAP    No family history on file.  Social History:   reports that he has never smoked. He has never used smokeless tobacco. He reports previous alcohol use. He reports that he does not use drugs.  Medications  Current Facility-Administered Medications:  .  acetaminophen (TYLENOL) tablet 650 mg, 650 mg, Oral, Q6H PRN **OR** acetaminophen (TYLENOL) suppository 650 mg, 650 mg, Rectal, Q6H PRN, , Tyrone A, DO .  atorvastatin (LIPITOR) tablet 20 mg, 20 mg, Oral, Daily, Kyle, Tyrone A, DO, 20 mg at 01/17/21 0840 .  enoxaparin (LOVENOX) injection 60 mg, 60 mg, Subcutaneous, Q24H, Kyle, Tyrone A, DO, 60 mg at 01/16/21 2245 .  insulin aspart (novoLOG) injection 0-20 Units, 0-20 Units, Subcutaneous, TID WC, Kyle, Tyrone A, DO, 7 Units at 01/17/21 1302 .  insulin aspart (novoLOG) injection 0-5 Units, 0-5 Units, Subcutaneous, QHS, Kyle, Tyrone A, DO .  insulin NPH Human (NOVOLIN N) injection 30 Units, 30 Units, Subcutaneous, BID AC & HS, Kyle, Tyrone A, DO, 30 Units at 01/17/21 0845 .  meclizine (ANTIVERT) tablet 50 mg, 50 mg, Oral, TID, Kyle, Tyrone A, DO, 50 mg at 01/17/21 0839 .  pimecrolimus (ELIDEL) 1 % cream 1 application, 1 application, Topical,  BID, Kyle, Tyrone A, DO .  polyvinyl alcohol (LIQUIFILM TEARS) 1.4 % ophthalmic solution 1 drop, 1 drop, Both Eyes, Daily, Kyle, Tyrone A, DO .  predniSONE (DELTASONE) tablet 60 mg, 60 mg, Oral, Q breakfast, Narda Bonds, MD, 60 mg at 01/17/21 0839 .  prochlorperazine (COMPAZINE) injection 10 mg, 10 mg, Intravenous, Q6H PRN, Kyle, Tyrone A, DO, 10 mg at 01/17/21 0839 .  valACYclovir (VALTREX) tablet 1,000 mg, 1,000 mg, Oral, TID, Narda Bonds, MD   Exam: Current vital signs: BP 125/64 (BP Location:  Left Arm)   Pulse (!) 52   Temp 98.3 F (36.8 C)   Resp 20   Ht 6' 1.5" (1.867 m)   Wt 117.9 kg   SpO2 93%   BMI 33.84 kg/m  Vital signs in last 24 hours: Temp:  [97.8 F (36.6 C)-99 F (37.2 C)] 98.3 F (36.8 C) (03/18 1319) Pulse Rate:  [50-60] 52 (03/18 1319) Resp:  [16-20] 20 (03/18 1319) BP: (101-125)/(64-78) 125/64 (03/18 1319) SpO2:  [93 %-97 %] 93 % (03/18 1319)  GENERAL: Awake, alert in NAD HEENT: - Normocephalic and atraumatic, no cervical lymphadenopathy. Bilateral EAC clear. Bilateral TM with good light reflex and clear fluid behind TM. No otitis media. No redness or drainage.  LUNGS - Normal respiratory effort. SaO2 CV - RRR ABDOMEN - Soft, nontender Ext: warm, well perfused Psych: affect light, cooperative.   NEURO:  Mental Status: AA&Ox3  Speech/Language: speech is without dysarthria or aphasia. Naming, repetition, fluency, and comprehension intact. Cranial Nerves:  II: PERRL 40mm/brisk. visual fields full.  III, IV, VI: EOMI. L beating nystagmus with rotary component on Lward gaze. V: sensation is intact and symmetrical to face. Moves jaw back and forth.  VII: Right facial droop involving upper and lower face qand preventing complete eyelid closure on R. VIII: hearing intact to voice IX, X: palate elevation is symmetric. Phonation normal.  XI: normal sternocleidomastoid and trapezius muscle strength NVB:TYOMAY is symmetrical without fasciculations.   Motor: 5/5 strength is all muscle groups.  Tone is normal. Bulk is normal.  Sensation- Intact to light touch bilaterally in all four extremities. Extinction absent to light touch to DSS.  Coordination: FTN intact bilaterally. HKS intact bilaterally. No pronator drift.  DTRs: 1+ throughout.  Cerebellum: Rhomberg test reveals swaying, but no leaning to one side.  Gait-able to walk to bathroom without assistance.   Labs I have reviewed labs in epic and the results pertinent to this consultation are: HbA1c  8.8%. TSH .402  Glucose 118-209   CBC normal  Na 140   K 3.6  HIV negative  CBC    Component Value Date/Time   WBC 6.6 01/17/2021 0311   RBC 4.57 01/17/2021 0311   HGB 13.4 01/17/2021 0311   HCT 40.6 01/17/2021 0311   PLT 182 01/17/2021 0311   MCV 88.8 01/17/2021 0311   MCH 29.3 01/17/2021 0311   MCHC 33.0 01/17/2021 0311   RDW 13.1 01/17/2021 0311   LYMPHSABS 1.1 01/16/2021 0738   MONOABS 0.4 01/16/2021 0738   EOSABS 0.1 01/16/2021 0738   BASOSABS 0.0 01/16/2021 0738    CMP     Component Value Date/Time   NA 140 01/17/2021 0311   K 3.6 01/17/2021 0311   CL 107 01/17/2021 0311   CO2 26 01/17/2021 0311   GLUCOSE 165 (H) 01/17/2021 0311   BUN 14 01/17/2021 0311   CREATININE 0.72 01/17/2021 0311   CALCIUM 8.8 (L) 01/17/2021 0311   PROT 5.9 (L) 01/17/2021  6160   ALBUMIN 3.6 01/17/2021 0311   AST 24 01/17/2021 0311   ALT 44 01/17/2021 0311   ALKPHOS 51 01/17/2021 0311   BILITOT 0.9 01/17/2021 0311   GFRNONAA >60 01/17/2021 7371      Imaging MD reviewed the images obtained MRI brain wo contrast No evidence of recent infarction, hemorrhage, or mass. Minor burden of nonspecific gliosis/demyelination in the cerebral white matter.   Assessment: 55 yo male who presented to the [facility] ED on 01/16/21 with c/o vertigo as described in HPI. CN VIII involvement as evidenced by vertigo. Doubt this is BPPV as changing his position does not affect his symptoms. If vertigo was his only symptom, then this would be consistent with vestibular neuronitis. However, he has CN VII and VIII involvement. His CN VII involvement could be Bell's palsy with a House-Brackmann grade of VI-moderate to severe. Medicine has already started Prednisone and anti viral which is appropriate with this grade for Bell's palsy. However, his MRI read states there is evidence of gliosis or demyelination of white matter, so we must consider demyelinating disease such as MS. However, he does not really have any other  symptoms suspicious for MS. Will check MRI brain with and without to r/o infection. MRI Cspine to r/o spinal demyelination. CTA head and neck to check extra and intracranial vessels. If further evidence of demyelination, he will need high dose steroids.   Per PT assessment yesterday, his Dike-Hallpike maneuver was negative on left and right as his symptoms/nystagmus did not change after 60 seconds. This makes BPPV less likely.    Impression: Vertigo with CN VII and VIII involvement. Query vestibular neuronitis and Bell's palsy.   Recommendations: -MRI brain with and without contrast. -CTA head and neck to check vessels -fasting lipid panel -Vitamin B12 level given tingling.  -Continue steroids per medicine team, but will need high dose steroids if MS confirmed. -Continue Valcyclovir.   -Continue PT for vestibular therapy.   Pt seen by Jimmye Norman, NP/Neuro and later by MD.  Pager: [phone]   Note written by Ms. Craige Cotta and edited by me to reflect my findings and recommendation. I did personally examine the patient. CTA head and neck performed this evening showed no significant abnormalities. MRI brain wo contrast showed small scattered T2?FLAIR hyperintensities in bilat hemispheres most predominant in the frontal regions. These are nonspecific. Radiology report suggested demyelinating disease but repeat MRI brain showed no contrast enhancement. Hx and current imaging not c/f demyelinating disease and that dx does not need to be pursued further unless additional neurologic concerns arise that would increase suspicion for that. His exam is c/w R Grade IV (severe) Bell's palsy preventing complete eye closure on the R. He has L beating nystagmus on Lward gaze (towards unaffected ear) with rotary component which may be seen in Bell's palsy and may indicate some degree of vestibular neuritis. No other CN involvement is reassuring. Agree with current tx prednisone 60mg  daily + valacylovir tid x7  days. No further inpatient neurologic w/u indicated. I will place amb referral to neurology to arrange outpatient neurology f/u in 4-6 wks. Pt instructed to seek immediate medical attention if any additional neurologic symptoms develop.  Neurology will not continue to actively follow, but please re-engage if additional neurologic concerns arise.  , MD [facility] [phone]  If 7pm- 7am, please page neurology on call as listed in AMION.

## 2021-01-17 NOTE — Plan of Care (Signed)
Plan of care reviewed and discussed with the patient. 

## 2021-01-17 NOTE — Progress Notes (Signed)
Physical Therapy Treatment Patient Details Name: John Barber MRN: [id number] DOB: 04-24-1966 Today's Date: 01/17/2021    History of Present Illness Angelo Prindle is a 55 y.o. male with medical history significant of diabetes. Presenting with dizziness, N/V. Reports that he was at dinner last night. When he went to leave, he became lightheaded and dizziness. He staggered as if he were drunk; but he had no alcohol. He complained of "room spinning" and vomited once he got back to his friend's car. All through out the night, he had continued N/V and dizziness.    PT Comments    Patient continues to experience dizziness today and Lt beating horizontal nystagmus remains present at rest and with Lt gaze but is now resolved during Rt gaze (now 2nd degree). He was able to perform mobility testing with dizziness remaining constant 2/4 on dizziness scale. Patient very unsteady with gait and during balance testing. Pt with Rt lean/LOB during Rhomberg (EC) and staggers Rt/Lt throughout gait requiring min assist to prevent LOB. He scored an 11/24 on his DGI indicating he is a High FALL Risk. Balance deficits continue to increase likelihood that pt is suffering from an acute peripheral unilateral vestibular hypofunction and typically this resolves in ~7 days. Educated pt on exercises for gaze stabilization. At this time pt is not safe to mobilize alone and would require assist for any mobility. He is not from this area and does not have family/friend who can assist him 24/7 right now. Acute PT will continue to progress pt during acute stay.   Follow Up Recommendations  Outpatient PT     Equipment Recommendations  None recommended by PT    Recommendations for Other Services       Precautions / Restrictions Precautions Precautions: Fall Restrictions Weight Bearing Restrictions: No    Mobility  Bed Mobility Overal bed mobility: Independent                  Transfers Overall  transfer level: Needs assistance   Transfers: Sit to/from Stand Sit to Stand: Supervision;Min guard         General transfer comment: guarding/sup for safety, pt with very wide BOS to stand.  Ambulation/Gait Ambulation/Gait assistance: Min assist Gait Distance (Feet): 400 Feet Assistive device: None Gait Pattern/deviations: Step-through pattern;Decreased stride length;Staggering left;Staggering right;Drifts right/left;Wide base of support Gait velocity: decr   General Gait Details: pt with very unsteady gait and Min assist required throughout to prevent LOB. Pt staggering Rt/Lt frequently and reached out for therapist and hallway rail for stability. DGI completed during gait and pt scored 11/24 indicating he is at significant risk of falling.   Stairs             Wheelchair Mobility    Modified Rankin (Stroke Patients Only)       Balance Overall balance assessment: Needs assistance Sitting-balance support: Feet supported;No upper extremity supported       Standing balance support: During functional activity;No upper extremity supported Standing balance-Leahy Scale: Fair   Single Leg Stance - Right Leg: 0 Single Leg Stance - Left Leg: 0 Tandem Stance - Right Leg: 0 Tandem Stance - Left Leg: 0 Rhomberg - Eyes Opened: 10 Rhomberg - Eyes Closed: 10 (Rt lean/fall)     Standardized Balance Assessment Standardized Balance Assessment : Dynamic Gait Index   Dynamic Gait Index Level Surface: Moderate Impairment Change in Gait Speed: Moderate Impairment Gait with Horizontal Head Turns: Moderate Impairment Gait with Vertical Head Turns: Moderate Impairment Gait  and Pivot Turn: Moderate Impairment Step Over Obstacle: Mild Impairment Step Around Obstacles: Mild Impairment Steps: Mild Impairment Total Score: 11      Cognition Arousal/Alertness: Awake/alert Behavior During Therapy: WFL for tasks assessed/performed Overall Cognitive Status: Within Functional  Limits for tasks assessed                 Exercises Other Exercises Other Exercises: Educated on X1 and X2 gaze stabilization exercises. Educated to complete for 1 min at a time and maybe 2-3 reps about 3-4 times per day. Pt demonstrated understanding of each exercise.    General Comments        Pertinent Vitals/Pain Pain Assessment: No/denies pain        Vestibular Assessment - 01/17/21 0001      Vestibular Assessment   General Observation Rt facial droop at upper eyelid and mouth. suspicious for CNVII impairment.      Oculomotor Exam   Spontaneous Left beating nystagmus   2nd degree (no nystagmus with Rt gaze)   Gaze-induced  Left beating nystagmus with L gaze   Lt beat at rest             PT Goals (current goals can now be found in the care plan section) Acute Rehab PT Goals Patient Stated Goal: get back to [place] PT Goal Formulation: With patient Time For Goal Achievement: 01/30/21 Potential to Achieve Goals: Good Progress towards PT goals: Progressing toward goals    Frequency    Min 3X/week      PT Plan Current plan remains appropriate    Co-evaluation              AM-PAC PT "6 Clicks" Mobility   Outcome Measure  Help needed turning from your back to your side while in a flat bed without using bedrails?: None Help needed moving from lying on your back to sitting on the side of a flat bed without using bedrails?: None Help needed moving to and from a bed to a chair (including a wheelchair)?: A Little Help needed standing up from a chair using your arms (e.g., wheelchair or bedside chair)?: None Help needed to walk in hospital room?: A Little Help needed climbing 3-5 steps with a railing? : A Little 6 Click Score: 21    End of Session Equipment Utilized During Treatment: Gait belt Activity Tolerance: Patient tolerated treatment well Patient left: in bed;with call bell/phone within reach;Other (comment) (lab tech in room)   PT Visit  Diagnosis: Unsteadiness on feet (R26.81);Other abnormalities of gait and mobility (R26.89);Dizziness and giddiness (R42)      Time: 0932-6712 PT Time Calculation (min) (ACUTE ONLY): 25 min  Charges:  $Gait Training: 8-22 mins $Therapeutic Exercise: 8-22 mins                     Wynn Maudlin, DPT Acute Rehabilitation Services Office [phone] Pager [phone]     Anitra Lauth 01/17/2021, 2:08 PM

## 2021-01-17 NOTE — Progress Notes (Signed)
Pt called nursing into the room to say that the left side of his mouth "felt strange".  Dr Caleb Popp was called and he went in to see the patient.

## 2021-01-18 DIAGNOSIS — R42 Dizziness and giddiness: Secondary | ICD-10-CM | POA: Diagnosis not present

## 2021-01-18 LAB — LIPID PANEL
Cholesterol: 183 mg/dL (ref 0–200)
HDL: 56 mg/dL (ref >40)
LDL Cholesterol: 118 mg/dL — ABNORMAL HIGH (ref 0–99)
Total CHOL/HDL Ratio: 3.3 RATIO
Triglycerides: 43 mg/dL (ref <150)
VLDL: 9 mg/dL (ref 0–40)

## 2021-01-18 LAB — GLUCOSE, CAPILLARY
Glucose-Capillary: 127 mg/dL — ABNORMAL HIGH (ref 70–99)
Glucose-Capillary: 266 mg/dL — ABNORMAL HIGH (ref 70–99)

## 2021-01-18 MED ORDER — POLYVINYL ALCOHOL 1.4 % OP SOLN: 1.0000 [drp] | Freq: Every day | OPHTHALMIC | 0 refills | Status: AC

## 2021-01-18 MED ORDER — MECLIZINE HCL 50 MG PO TABS: 50.0000 mg | ORAL_TABLET | Freq: Three times a day (TID) | ORAL | 0 refills | Status: AC | PRN

## 2021-01-18 MED ORDER — VALACYCLOVIR HCL 1 G PO TABS: 1000.0000 mg | ORAL_TABLET | Freq: Three times a day (TID) | ORAL | 0 refills | Status: AC

## 2021-01-18 MED ORDER — VALACYCLOVIR HCL 1 G PO TABS
1000.0000 mg | ORAL_TABLET | Freq: Three times a day (TID) | ORAL | 0 refills | Status: DC
Start: 2021-01-18 — End: 2021-01-18

## 2021-01-18 MED ORDER — POLYVINYL ALCOHOL 1.4 % OP SOLN: 1.0000 [drp] | Freq: Every day | OPHTHALMIC | 0 refills | Status: DC

## 2021-01-18 MED ORDER — ARTIFICIAL TEARS OPHTHALMIC OINT: TOPICAL_OINTMENT | Freq: Every day | OPHTHALMIC | Status: AC

## 2021-01-18 MED ORDER — PROCHLORPERAZINE MALEATE 10 MG PO TABS: 10.0000 mg | ORAL_TABLET | Freq: Four times a day (QID) | ORAL | 0 refills | Status: AC | PRN

## 2021-01-18 MED ORDER — PREDNISONE 20 MG PO TABS: 60.0000 mg | ORAL_TABLET | Freq: Every day | ORAL | 0 refills | Status: AC

## 2021-01-18 MED ORDER — HYPROMELLOSE (GONIOSCOPIC) 2.5 % OP SOLN: 1.0000 [drp] | Freq: Four times a day (QID) | OPHTHALMIC | 0 refills | Status: AC

## 2021-01-18 MED ORDER — MECLIZINE HCL 50 MG PO TABS: 50.0000 mg | ORAL_TABLET | Freq: Three times a day (TID) | ORAL | 0 refills | Status: DC

## 2021-01-18 MED ORDER — PREDNISONE 20 MG PO TABS
60.0000 mg | ORAL_TABLET | Freq: Every day | ORAL | 0 refills | Status: DC
Start: 2021-01-19 — End: 2021-01-18

## 2021-01-18 MED ORDER — PROCHLORPERAZINE MALEATE 10 MG PO TABS: 10.0000 mg | ORAL_TABLET | Freq: Four times a day (QID) | ORAL | 0 refills | Status: DC | PRN

## 2021-01-18 MED ORDER — HYPROMELLOSE (GONIOSCOPIC) 2.5 % OP SOLN: 1.0000 [drp] | Freq: Four times a day (QID) | OPHTHALMIC | 0 refills | Status: DC

## 2021-01-18 NOTE — Progress Notes (Signed)
PT Cancellation Note  Patient Details Name: John Barber MRN: [id number] DOB: February 11, 1966   Cancelled Treatment:    Reason Eval/Treat Not Completed: Patient declined, no reason specified Pt up in room packing and reports d/c today.  Pt reports still feeling "off" however dizziness improved.  Pt calling ride for d/c.   Raniah Karan,KATHrine E 01/18/2021, 2:39 PM Thomasene Mohair PT, DPT Acute Rehabilitation Services Pager: [phone] Office: [phone]

## 2021-01-18 NOTE — Discharge Instructions (Signed)
John Barber,  You are in hospital because of vertigo.  You also found to have some right-sided facial droop which was consistent with Bell's palsy.  This is not evidence of a stroke.  Your MRI of your brain did not show evidence of a stroke.  You have been prescribed steroids and an antiviral to help treat this Bell's palsy.  Neurology has seen you in the hospital and recommend that you follow-up with neurology as an outpatient in about 4 weeks.  Please ensure that your right eye remains protected at all times while you are still having symptoms of Bell's palsy.  I have prescribed artificial tears, for which she can get over-the-counter.  Please use the drops during the daytime and the ointment at nighttime.  Please tape your right eye down every night before you go to bed to keep it protected while you heal.

## 2021-01-18 NOTE — Progress Notes (Signed)
Pt discharged to home with friend. Patient belongings given back. Patient educated about discharge instruction and verbalize understanding.

## 2021-01-18 NOTE — Discharge Summary (Signed)
Physician Discharge Summary  Miraj Truss ZOX:[id number] DOB: Mar 11, 1966 DOA: 01/16/2021  PCP: Pcp, No  Admit date: 01/16/2021 Discharge date: 01/18/2021  Admitted From: Home Disposition: Home  Recommendations for Outpatient Follow-up:  1. Follow up with PCP in 1 week 2. Follow up with neurology in 4 weeks 3. Please follow up on the following pending results: None  Home Health: Outpatient physical therapy Equipment/Devices: None  Discharge Condition: Stable CODE STATUS: Full code Diet recommendation: Heart healthy  Brief/Interim Summary:  Admission HPI written by Teddy Spike, DO   Chief Complaint: "room spinning"  HPI: John Barber is a 55 y.o. male with medical history significant of diabetes. Presenting with dizziness, N/V. Reports that he was at dinner last night. When he went to leave, he became lightheaded and dizziness. He staggered as if he were drunk; but he had no alcohol. He complained of "room spinning" and vomited once he got back to his friend's car. All through out the night, he had continued N/V and dizziness. He was unable to take any medicines to assist. He decided that he needed help in the ED. He denies any other aggravating or alleviating factors.    Hospital course:  Vertigo Unknown etiology. MRI without obvious etiology. Vestibular PT ordered and with concern for possible vestibular hypofunction leading to vertigo; recommending neurology, ENT and outpatient PT. neurology consulted and agreed with Bell's palsy diagnosis.  CTA head and neck, MR of the brain with and without contrast were obtained and were significant for no vascular abnormalities or contrast-enhanced lesions.    Bell's Palsy Right side. History of chicken pox but no history of varicella zoster.  Initially able to close right eye completely which worsened prior to discharge. Patient was started on prednisone 60 mg daily in addition to valacyclovir 1000 mg 3 times daily.   Plan for 7-day treatment.  Patient prescribed artificial tears solution and ointment for lubrication of right eye and provided recommendations for right eye protection while still having symptoms of Bell's palsy.  Patient recommended to follow-up with outpatient neurology in addition to outpatient physical therapy.  Left lower chin numbness Left V3 distribution. Seems to have improved. No MRI findings to account for findings.  Diabetes mellitus, type 2 Patient is on NPH 30 units at lunch and 45 units at bedtime in addition to glipizide 20 mg BID and metformin 1000 mg BID. Continue outpatient regimen.  Hyperlipidemia Continue Lipitor 20 mg daily  Sinus bradycardia Asymptomatic.  Elevated ALT Mild. Resolved.  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  Per patient, patient has a known history of thyroid nodule and is status post recent biopsy which likely accounts for necrosis.  Patient states that his primary care physician is following up with his thyroid nodule.  Recommend continued outpatient follow-up.  Discharge Diagnoses:  Principal Problem:   Bell's palsy Active Problems:   Vertigo    Discharge Instructions  Discharge Instructions    Increase activity slowly   Complete by: As directed      Allergies as of 01/18/2021   No Known Allergies     Medication List    STOP taking these medications   naphazoline-glycerin 0.012-0.2 % Soln Commonly known as: CLEAR EYES REDNESS     TAKE these medications   acetaminophen 650 MG CR tablet Commonly known as: TYLENOL Take 2,600 mg by mouth daily as needed for pain.   artificial tears Oint ophthalmic ointment Commonly known as: LACRILUBE Place into the right eye  at bedtime.   atorvastatin 20 MG tablet Commonly known as: LIPITOR Take 20 mg by mouth daily.   glipiZIDE 10 MG tablet Commonly known as: GLUCOTROL Take 20 mg by mouth 2 (two) times daily before a meal.   HumuLIN N KwikPen 100 UNIT/ML  Kiwkpen Generic drug: Insulin NPH (Human) (Isophane) Inject 30-45 Units into the skin See admin instructions. Inject 30 units daily with lunch and 45 units at bedtime. Increase by 2 units at lunch until bedtime blood sugar is less than 140.   hydroxypropyl methylcellulose / hypromellose 2.5 % ophthalmic solution Commonly known as: ISOPTO TEARS / GONIOVISC Place 1 drop into the right eye 4 (four) times daily.   meclizine 50 MG tablet Commonly known as: ANTIVERT Take 1 tablet (50 mg total) by mouth 3 (three) times daily.   metFORMIN 1000 MG tablet Commonly known as: GLUCOPHAGE Take 1,000 mg by mouth 2 (two) times daily.   pimecrolimus 1 % cream Commonly known as: ELIDEL Apply 1 application topically in the morning and at bedtime.   polyvinyl alcohol 1.4 % ophthalmic solution Commonly known as: LIQUIFILM TEARS Place 1 drop into both eyes daily. Start taking on: January 19, 2021   predniSONE 20 MG tablet Commonly known as: DELTASONE Take 3 tablets (60 mg total) by mouth daily with breakfast for 5 days. Start taking on: January 19, 2021   prochlorperazine 10 MG tablet Commonly known as: COMPAZINE Take 1 tablet (10 mg total) by mouth every 6 (six) hours as needed for nausea or vomiting.   testosterone cypionate 200 MG/ML injection Commonly known as: DEPOTESTOSTERONE CYPIONATE Inject 200 mg into the muscle once a week.   valACYclovir 1000 MG tablet Commonly known as: VALTREX Take 1 tablet (1,000 mg total) by mouth 3 (three) times daily for 6 days.       Follow-up Information    Neurology. Schedule an appointment as soon as possible for a visit in 4 week(s).   Why: Please make an appointment with a neurologist and be seen in the next 4-6 weeks.       Won Choe,DO. Schedule an appointment as soon as possible for a visit in 1 week(s).   Why: Hospital follow-up Contact information: [address] [place], [place] [address]             No Known  Allergies  Consultations:  Neurology   Procedures/Studies: CT ANGIO HEAD W OR WO CONTRAST  Result Date: 01/17/2021 CLINICAL DATA:  Possible stroke.  Vertigo. EXAM: CT ANGIOGRAPHY HEAD AND NECK TECHNIQUE: Multidetector CT imaging of the head and neck was performed using the standard protocol during bolus administration of intravenous contrast. Multiplanar CT image reconstructions and MIPs were obtained to evaluate the vascular anatomy. Carotid stenosis measurements (when applicable) are obtained utilizing NASCET criteria, using the distal internal carotid diameter as the denominator. CONTRAST:  OMNIPAQUE IOHEXOL 350 MG/ML SOLN COMPARISON:  MRI yesterday FINDINGS: CT HEAD FINDINGS Brain: The brain shows a normal appearance without evidence of malformation, atrophy, old or acute small or large vessel infarction, mass lesion, hemorrhage, hydrocephalus or extra-axial collection. Vascular: No hyperdense vessel. No evidence of atherosclerotic calcification. Skull: Normal.  No traumatic finding.  No focal bone lesion. Sinuses/Orbits: Sinuses are clear. Orbits appear normal. Mastoids are clear. Other: None significant CTA NECK FINDINGS Aortic arch: Normal. No atherosclerotic change. Branching pattern is normal. Right carotid system: Common carotid artery widely patent to the bifurcation. Carotid bifurcation is normal without soft or calcified plaque. Cervical ICA is normal. Left  carotid system: Common carotid artery widely patent to the bifurcation. Carotid bifurcation is normal without soft or calcified plaque. Cervical ICA is normal. Vertebral arteries: Both vertebral artery origins are widely patent. Right vertebral artery is dominant. Both vertebral arteries are patent through the cervical region to the foramen magnum. Skeleton: Ordinary cervical spondylosis. Other neck: 6 cm mass/nodule of the right lobe of the thyroid with central necrosis. No evidence of regional adenopathy. Upper chest: Normal Review  of the MIP images confirms the above findings CTA HEAD FINDINGS Anterior circulation: Both internal carotid arteries widely patent through the skull base and siphon region. The anterior and middle cerebral vessels are normal. No large or medium vessel occlusion. No stenosis, aneurysm or vascular malformation. Posterior circulation: Both vertebral arteries are widely patent to the basilar. No basilar stenosis. Posterior circulation branch vessels are normal. Venous sinuses: Patent and normal. Anatomic variants: None significant. Review of the MIP images confirms the above findings IMPRESSION: 1. Normal CT appearance of the brain itself. 2. Normal CT angiography of the neck and head. No atherosclerotic disease. No large or medium vessel occlusion. 3. 6 cm mass/nodule of the right lobe of the thyroid with central necrosis. Recommend thyroid US (ref: J Am Coll Radiol. 2015 Feb;12(2): 143-50). Electronically Signed   By: Paulina FusiMark  Shogry M.D.   On: 01/17/2021 18:02   CT ANGIO NECK W OR WO CONTRAST  Result Date: 01/17/2021 CLINICAL DATA:  Possible stroke.  Vertigo. EXAM: CT ANGIOGRAPHY HEAD AND NECK TECHNIQUE: Multidetector CT imaging of the head and neck was performed using the standard protocol during bolus administration of intravenous contrast. Multiplanar CT image reconstructions and MIPs were obtained to evaluate the vascular anatomy. Carotid stenosis measurements (when applicable) are obtained utilizing NASCET criteria, using the distal internal carotid diameter as the denominator. CONTRAST:  100mL OMNIPAQUE IOHEXOL 350 MG/ML SOLN COMPARISON:  MRI yesterday FINDINGS: CT HEAD FINDINGS Brain: The brain shows a normal appearance without evidence of malformation, atrophy, old or acute small or large vessel infarction, mass lesion, hemorrhage, hydrocephalus or extra-axial collection. Vascular: No hyperdense vessel. No evidence of atherosclerotic calcification. Skull: Normal.  No traumatic finding.  No focal bone lesion.  Sinuses/Orbits: Sinuses are clear. Orbits appear normal. Mastoids are clear. Other: None significant CTA NECK FINDINGS Aortic arch: Normal. No atherosclerotic change. Branching pattern is normal. Right carotid system: Common carotid artery widely patent to the bifurcation. Carotid bifurcation is normal without soft or calcified plaque. Cervical ICA is normal. Left carotid system: Common carotid artery widely patent to the bifurcation. Carotid bifurcation is normal without soft or calcified plaque. Cervical ICA is normal. Vertebral arteries: Both vertebral artery origins are widely patent. Right vertebral artery is dominant. Both vertebral arteries are patent through the cervical region to the foramen magnum. Skeleton: Ordinary cervical spondylosis. Other neck: 6 cm mass/nodule of the right lobe of the thyroid with central necrosis. No evidence of regional adenopathy. Upper chest: Normal Review of the MIP images confirms the above findings CTA HEAD FINDINGS Anterior circulation: Both internal carotid arteries widely patent through the skull base and siphon region. The anterior and middle cerebral vessels are normal. No large or medium vessel occlusion. No stenosis, aneurysm or vascular malformation. Posterior circulation: Both vertebral arteries are widely patent to the basilar. No basilar stenosis. Posterior circulation branch vessels are normal. Venous sinuses: Patent and normal. Anatomic variants: None significant. Review of the MIP images confirms the above findings IMPRESSION: 1. Normal CT appearance of the brain itself. 2. Normal CT angiography of the  neck and head. No atherosclerotic disease. No large or medium vessel occlusion. 3. 6 cm mass/nodule of the right lobe of the thyroid with central necrosis. Recommend thyroid US (ref: J Am Coll Radiol. 2015 Feb;12(2): 143-50). Electronically Signed   By: Paulina Fusi M.D.   On: 01/17/2021 18:02   MR BRAIN WO CONTRAST  Result Date: 01/16/2021 CLINICAL DATA:   Vertigo EXAM: MRI HEAD WITHOUT CONTRAST TECHNIQUE: Multiplanar, multiecho pulse sequences of the brain and surrounding structures were obtained without intravenous contrast. COMPARISON:  None. FINDINGS: Brain: There is no acute infarction or intracranial hemorrhage. There is no intracranial mass, mass effect, or edema. There is no hydrocephalus or extra-axial fluid collection. Ventricles and sulci are normal in size and configuration. Few scattered small foci of T2 hyperintensity in the supratentorial white matter likely reflect nonspecific gliosis/demyelination. Vascular: Major vessel flow voids at the skull base are preserved. Skull and upper cervical spine: Normal marrow signal is preserved. Sinuses/Orbits: Paranasal sinuses are aerated. Orbits are unremarkable. Other: Sella is unremarkable.  Mastoid air cells are clear. IMPRESSION: No evidence of recent infarction, hemorrhage, or mass. Minor burden of nonspecific gliosis/demyelination in the cerebral white matter. Electronically Signed   By: Guadlupe Spanish M.D.   On: 01/16/2021 11:34   MR BRAIN W CONTRAST  Result Date: 01/17/2021 CLINICAL DATA:  Dizziness. EXAM: MRI HEAD WITH CONTRAST TECHNIQUE: Multiplanar, multiecho pulse sequences of the brain and surrounding structures were obtained with intravenous contrast. CONTRAST:  49mL GADAVIST GADOBUTROL 1 MMOL/ML IV SOLN COMPARISON:  CT studies same day.  MRI yesterday. FINDINGS: Brain: Diffusion imaging does not show any acute or subacute infarction or other cause of restricted diffusion. Postcontrast imaging does not show any abnormal enhancement of brain or leptomeninges. Vascular: Major vessels at the base of the brain show flow. Skull and upper cervical spine: Negative Sinuses/Orbits: Clear/normal Other: None IMPRESSION: Postcontrast study does not show any abnormal contrast enhancement. Diffusion imaging remains negative. Electronically Signed   By: Paulina Fusi M.D.   On: 01/17/2021 19:44   MR CERVICAL  SPINE W WO CONTRAST  Result Date: 01/17/2021 CLINICAL DATA:  Demyelinating disease.  Dizziness. EXAM: MRI CERVICAL SPINE WITHOUT AND WITH CONTRAST TECHNIQUE: Multiplanar and multiecho pulse sequences of the cervical spine, to include the craniocervical junction and cervicothoracic junction, were obtained without and with intravenous contrast. CONTRAST:  76mL GADAVIST GADOBUTROL 1 MMOL/ML IV SOLN COMPARISON:  None. FINDINGS: Alignment: Normal Vertebrae: No fracture or primary bone lesion. Cord: No cord compression or primary cord lesion. No evidence of demyelinating disease. No abnormal enhancement. Posterior Fossa, vertebral arteries, paraspinal tissues: Negative Disc levels: Foramen magnum is widely patent.  C1-2 is normal. C2-3: Minimal bulging of the disc.  No canal or foraminal stenosis. C3-4: Chronic disc degeneration with loss of disc height. Endplate osteophytes and bulging of the disc. No compressive canal narrowing. Mild bony foraminal narrowing on the left. C4-5: Minimal uncovertebral prominence on the left.  No stenosis. C5-6: Mild spondylosis with endplate osteophytes and bulging of the disc more prominent towards the left. No canal stenosis. Mild left foraminal narrowing. C6-7: Spondylosis with endplate osteophytes and bulging of the disc. No compressive canal stenosis. Mild foraminal narrowing the left. C7-T1: Normal interspace. IMPRESSION: 1. No evidence of demyelinating disease or other cord pathology. 2. Ordinary mild degenerative spondylosis. No compressive central canal stenosis. Mild left foraminal narrowing at C3-4, C5-6 and C6-7. Electronically Signed   By: Paulina Fusi M.D.   On: 01/17/2021 19:47      Subjective: Improvement  of symptoms slightly.  Discharge Exam: Vitals:   01/18/21 0532 01/18/21 0533  BP: 124/80   Pulse: (!) 46 (!) 48  Resp: 18   Temp: 98.7 F (37.1 C)   SpO2: 94%    Vitals:   01/17/21 1319 01/17/21 2131 01/18/21 0532 01/18/21 0533  BP: 125/64 124/85  124/80   Pulse: (!) 52 (!) 53 (!) 46 (!) 48  Resp: 20 17 18    Temp: 98.3 F (36.8 C) 98.5 F (36.9 C) 98.7 F (37.1 C)   TempSrc:  Oral Oral   SpO2: 93% 94% 94%   Weight:      Height:        General: Pt is alert, awake, not in acute distress Cardiovascular: RRR, S1/S2 +, no rubs, no gallops Respiratory: CTA bilaterally, no wheezing, no rhonchi Abdominal: Soft, NT, ND, bowel sounds + Extremities: no edema, no cyanosis Neurologic: Significant right facial droop involving lower and upper face in addition to ptosis of right eye.  Patient cannot completely close his right eye.    The results of significant diagnostics from this hospitalization (including imaging, microbiology, ancillary and laboratory) are listed below for reference.     Microbiology: Recent Results (from the past 240 hour(s))  SARS CORONAVIRUS 2 (TAT 6-24 HRS) Nasopharyngeal Nasopharyngeal Swab     Status: None   Collection Time: 01/16/21 12:41 PM   Specimen: Nasopharyngeal Swab  Result Value Ref Range Status   SARS Coronavirus 2 NEGATIVE NEGATIVE Final    Comment: (NOTE) SARS-CoV-2 target nucleic acids are NOT DETECTED.  The SARS-CoV-2 RNA is generally detectable in upper and lower respiratory specimens during the acute phase of infection. Negative results do not preclude SARS-CoV-2 infection, do not rule out co-infections with other pathogens, and should not be used as the sole basis for treatment or other patient management decisions. Negative results must be combined with clinical observations, patient history, and epidemiological information. The expected result is Negative.  Fact Sheet for Patients: 01/18/21  Fact Sheet for Healthcare Providers: HairSlick.no  This test is not yet approved or cleared by the [place] FDA and  has been authorized for detection and/or diagnosis of SARS-CoV-2 by FDA under an Emergency Use Authorization  (EUA). This EUA will remain  in effect (meaning this test can be used) for the duration of the COVID-19 declaration under Se ction 564(b)(1) of the Act, 21 U.S.C. section 360bbb-3(b)(1), unless the authorization is terminated or revoked sooner.  Performed at [facility], [address], [place], [place]      Labs: BNP (last 3 results) No results for input(s): BNP in the last 8760 hours. Basic Metabolic Panel: Recent Labs  Lab 01/16/21 0738 01/16/21 1549 01/17/21 0311  NA 138  --  140  K 4.2  --  3.6  CL 104  --  107  CO2 25  --  26  GLUCOSE 265*  --  165*  BUN 13  --  14  CREATININE 0.73 0.78 0.72  CALCIUM 9.1  --  8.8*   Liver Function Tests: Recent Labs  Lab 01/16/21 0738 01/17/21 0311  AST 28 24  ALT 53* 44  ALKPHOS 57 51  BILITOT 1.1 0.9  PROT 6.8 5.9*  ALBUMIN 4.3 3.6   Recent Labs  Lab 01/16/21 0738  LIPASE 25   No results for input(s): AMMONIA in the last 168 hours. CBC: Recent Labs  Lab 01/16/21 0738 01/16/21 1549 01/17/21 0311  WBC 6.8 7.7 6.6  NEUTROABS 5.2  --   --  HGB 14.0 13.6 13.4  HCT 41.1 40.6 40.6  MCV 86.9 87.9 88.8  PLT 170 194 182   Cardiac Enzymes: No results for input(s): CKTOTAL, CKMB, CKMBINDEX, TROPONINI in the last 168 hours. BNP: Invalid input(s): POCBNP CBG: Recent Labs  Lab 01/17/21 0742 01/17/21 1203 01/17/21 2128 01/18/21 0811 01/18/21 1247  GLUCAP 118* 209* 253* 127* 266*   D-Dimer No results for input(s): DDIMER in the last 72 hours. Hgb A1c Recent Labs    01/16/21 1549  HGBA1C 8.8*   Lipid Profile Recent Labs    01/18/21 0315  CHOL 183  HDL 56  LDLCALC 118*  TRIG 43  CHOLHDL 3.3   Thyroid function studies Recent Labs    01/17/21 1234  TSH 0.402   Anemia work up No results for input(s): VITAMINB12, FOLATE, FERRITIN, TIBC, IRON, RETICCTPCT in the last 72 hours. Urinalysis No results found for: COLORURINE, APPEARANCEUR, LABSPEC, PHURINE, GLUCOSEU, HGBUR, BILIRUBINUR,  KETONESUR, PROTEINUR, UROBILINOGEN, NITRITE, LEUKOCYTESUR Sepsis Labs Invalid input(s): PROCALCITONIN,  WBC,  LACTICIDVEN Microbiology Recent Results (from the past 240 hour(s))  SARS CORONAVIRUS 2 (TAT 6-24 HRS) Nasopharyngeal Nasopharyngeal Swab     Status: None   Collection Time: 01/16/21 12:41 PM   Specimen: Nasopharyngeal Swab  Result Value Ref Range Status   SARS Coronavirus 2 NEGATIVE NEGATIVE Final    Comment: (NOTE) SARS-CoV-2 target nucleic acids are NOT DETECTED.  The SARS-CoV-2 RNA is generally detectable in upper and lower respiratory specimens during the acute phase of infection. Negative results do not preclude SARS-CoV-2 infection, do not rule out co-infections with other pathogens, and should not be used as the sole basis for treatment or other patient management decisions. Negative results must be combined with clinical observations, patient history, and epidemiological information. The expected result is Negative.  Fact Sheet for Patients: HairSlick.no  Fact Sheet for Healthcare Providers: quierodirigir.com  This test is not yet approved or cleared by the [place] FDA and  has been authorized for detection and/or diagnosis of SARS-CoV-2 by FDA under an Emergency Use Authorization (EUA). This EUA will remain  in effect (meaning this test can be used) for the duration of the COVID-19 declaration under Se ction 564(b)(1) of the Act, 21 U.S.C. section 360bbb-3(b)(1), unless the authorization is terminated or revoked sooner.  Performed at [facility], [address] [place], [place] [address]     SIGNED:   Jacquelin Hawking, MD [facility] 01/18/2021, 1:53 PM

## 2022-11-07 IMAGING — CT CT ANGIO NECK
2 of 11 series · 7 of 33 positions shown · IV contrast (omnipaque)
Comparison: MRI yesterday

CLINICAL DATA: Possible stroke.  Vertigo.

EXAM:
CT ANGIOGRAPHY HEAD AND NECK
TECHNIQUE: Multidetector CT imaging of the head and neck was performed using
the standard protocol during bolus administration of intravenous
contrast. Multiplanar CT image reconstructions and MIPs were
obtained to evaluate the vascular anatomy. Carotid stenosis
measurements (when applicable) are obtained utilizing NASCET
criteria, using the distal internal carotid diameter as the
denominator.
CONTRAST:  100mL OMNIPAQUE IOHEXOL 350 MG/ML SOLN

[Series 9: cta head neck · axial · 0.52mm/px · z∈[+1502,+1638]mm · 2 of 204 slices shown]
[im 68/204  soft-tissue]
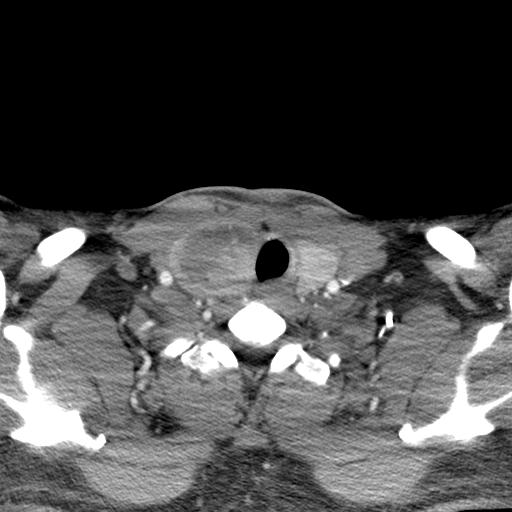
[im 136/204  soft-tissue]
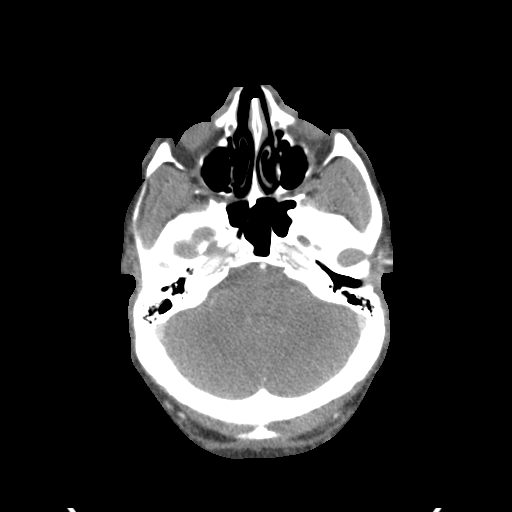

[Series 11: ax thin · axial · 0.40mm/px · z∈[+1435,+1707]mm · 5 of 408 slices shown]
[im 68/408  soft-tissue]
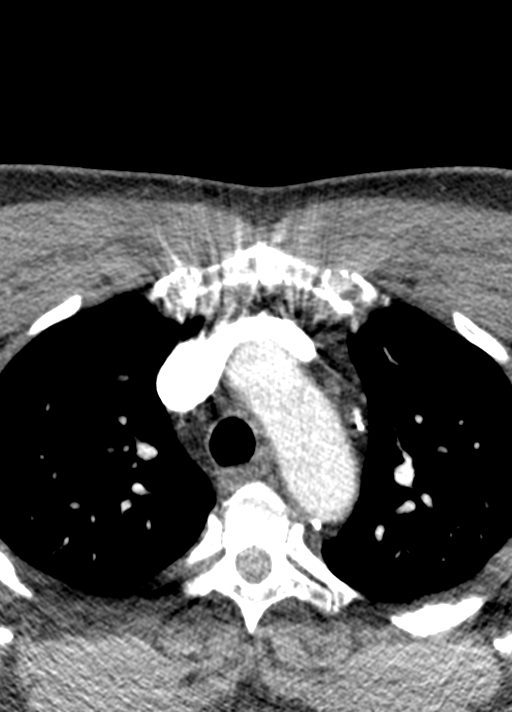
[im 136/408  bone]
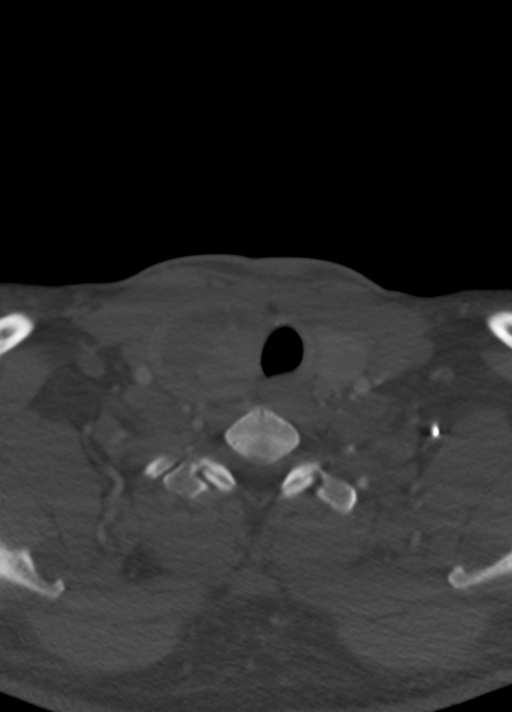
[im 204/408  soft-tissue]
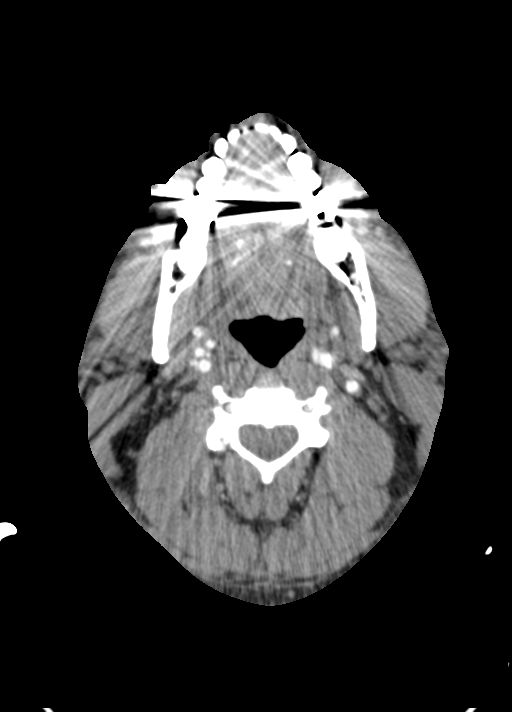
[im 272/408  bone]
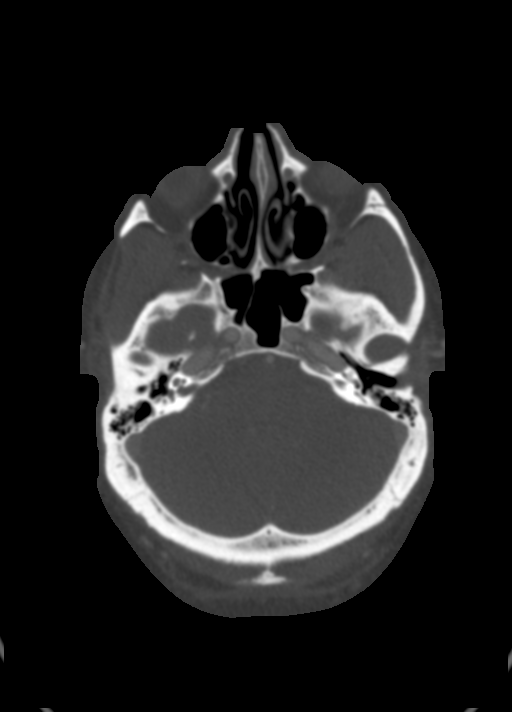
[im 340/408  soft-tissue]
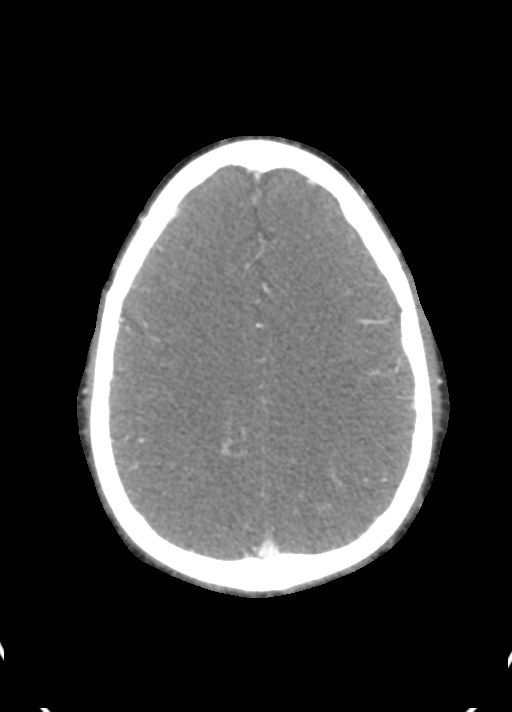

[7 of 33 positions shown; findings below may reference images not displayed]

FINDINGS: CT HEAD FINDINGS

Brain: The brain shows a normal appearance without evidence of
malformation, atrophy, old or acute small or large vessel
infarction, mass lesion, hemorrhage, hydrocephalus or extra-axial
collection.

Vascular: No hyperdense vessel. No evidence of atherosclerotic
calcification.

Skull: Normal.  No traumatic finding.  No focal bone lesion.

Sinuses/Orbits: Sinuses are clear. Orbits appear normal. Mastoids
are clear.

Other: None significant

CTA NECK FINDINGS

Aortic arch: Normal. No atherosclerotic change. Branching pattern is
normal.

Right carotid system: Common carotid artery widely patent to the
bifurcation. Carotid bifurcation is normal without soft or calcified
plaque. Cervical ICA is normal.

Left carotid system: Common carotid artery widely patent to the
bifurcation. Carotid bifurcation is normal without soft or calcified
plaque. Cervical ICA is normal.

Vertebral arteries: Both vertebral artery origins are widely patent.
Right vertebral artery is dominant. Both vertebral arteries are
patent through the cervical region to the foramen magnum.

Skeleton: Ordinary cervical spondylosis.

Other neck: 6 cm mass/nodule of the right lobe of the thyroid with
central necrosis. No evidence of regional adenopathy.

Upper chest: Normal

Review of the MIP images confirms the above findings

CTA HEAD FINDINGS

Anterior circulation: Both internal carotid arteries widely patent
through the skull base and siphon region. The anterior and middle
cerebral vessels are normal. No large or medium vessel occlusion. No
stenosis, aneurysm or vascular malformation.

Posterior circulation: Both vertebral arteries are widely patent to
the basilar. No basilar stenosis. Posterior circulation branch
vessels are normal.

Venous sinuses: Patent and normal.

Anatomic variants: None significant.

Review of the MIP images confirms the above findings
IMPRESSION: 1. Normal CT appearance of the brain itself.
2. Normal CT angiography of the neck and head. No atherosclerotic
disease. No large or medium vessel occlusion.
3. 6 cm mass/nodule of the right lobe of the thyroid with central
necrosis. Recommend thyroid US (ref: [HOSPITAL]. [id number]
